# Patient Record
Sex: Female | Born: 1995 | Race: White | Hispanic: No | Smoking: Never smoker
Health system: Southern US, Community
[De-identification: ages and names within clinical notes are randomized; demographics above are authoritative.]

## PROBLEM LIST (undated history)

## (undated) DIAGNOSIS — F909 Attention-deficit hyperactivity disorder, unspecified type: Secondary | ICD-10-CM

## (undated) DIAGNOSIS — R55 Syncope and collapse: Secondary | ICD-10-CM

## (undated) DIAGNOSIS — F419 Anxiety disorder, unspecified: Secondary | ICD-10-CM

## (undated) HISTORY — DX: Anxiety disorder, unspecified: F41.9

## (undated) HISTORY — PX: MOUTH SURGERY: SHX715

---
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
## 2017-03-10 ENCOUNTER — Ambulatory Visit (INDEPENDENT_AMBULATORY_CARE_PROVIDER_SITE_OTHER): Payer: BLUE CROSS/BLUE SHIELD | Admitting: Obstetrics and Gynecology

## 2017-03-10 ENCOUNTER — Encounter: Payer: Self-pay | Admitting: Obstetrics and Gynecology

## 2017-03-10 VITALS — BP 118/74 | Ht 65.0 in | Wt 162.0 lb

## 2017-03-10 DIAGNOSIS — L700 Acne vulgaris: Secondary | ICD-10-CM

## 2017-03-10 DIAGNOSIS — Z803 Family history of malignant neoplasm of breast: Secondary | ICD-10-CM

## 2017-03-10 DIAGNOSIS — Z30011 Encounter for initial prescription of contraceptive pills: Secondary | ICD-10-CM

## 2017-03-10 MED ORDER — NORGESTIMATE-ETH ESTRADIOL 0.25-35 MG-MCG PO TABS: 1.0000 | ORAL_TABLET | Freq: Every day | ORAL | 3 refills | Status: DC

## 2017-03-10 NOTE — Progress Notes (Signed)
Chief Complaint  Patient presents with  . Follow-up    medication follow up/ocp refill    HPI:      Ms. Toni Cobb is a 21 y.o. G0P0000 who LMP was Patient's last menstrual period was 02/23/2017., presents today for [facility] restart. She was last seen 2/17 and started on sprintec for acne with sx improvement. Pt stopped pills 12/17 because complexion was so good. Sx have worsened and pt wants to restart pills. No side effects. No hx of migraines with aura/HTN/clotting disorders.  She has never been sex active.  Menses are monthly, last 5 day, no BTB, no dysmen.  No tobacco, alcohol, drug use.  FH breast cancer in her mom, who is BRCA neg.    Past Medical History:  Diagnosis Date  . ADHD (attention deficit hyperactivity disorder)   . Syncope     Past Surgical History:  Procedure Laterality Date  . MOUTH SURGERY      Family History  Problem Relation Age of Onset  . Breast cancer Mother     Social History   Social History  . Marital status: Unknown    Spouse name: N/A  . Number of children: N/A  . Years of education: N/A   Occupational History  . Not on file.   Social History Main Topics  . Smoking status: Never Smoker  . Smokeless tobacco: Never Used  . Alcohol use No  . Drug use: No  . Sexual activity: Not Currently    Birth control/ protection: Pill   Other Topics Concern  . Not on file   Social History Narrative   ** Merged History Encounter **         Current Outpatient Prescriptions:  .  FLUoxetine (PROZAC) 20 MG tablet, Take by mouth., Disp: , Rfl:  .  amphetamine-dextroamphetamine (ADDERALL) 10 MG tablet, Take 10 mg by mouth daily., Disp: , Rfl:  .  norgestimate-ethinyl estradiol (ORTHO-CYCLEN,SPRINTEC,PREVIFEM) 0.25-35 MG-MCG tablet, Take 1 tablet by mouth daily., Disp: 3 Package, Rfl: 3   ROS:  Review of Systems  Constitutional: Negative for fatigue, fever and unexpected weight change.  Respiratory: Negative for cough, shortness of breath  and wheezing.   Cardiovascular: Negative for chest pain, palpitations and leg swelling.  Gastrointestinal: Negative for blood in stool, constipation, diarrhea, nausea and vomiting.  Endocrine: Negative for cold intolerance, heat intolerance and polyuria.  Genitourinary: Positive for vaginal discharge. Negative for dyspareunia, dysuria, flank pain, frequency, genital sores, hematuria, menstrual problem, pelvic pain, urgency, vaginal bleeding and vaginal pain.  Musculoskeletal: Negative for back pain, joint swelling and myalgias.  Skin: Negative for rash.  Neurological: Negative for dizziness, syncope, light-headedness, numbness and headaches.  Hematological: Negative for adenopathy.  Psychiatric/Behavioral: Negative for agitation, confusion, sleep disturbance and suicidal ideas. The patient is not nervous/anxious.      OBJECTIVE:   Vitals:  BP 118/74   Ht '5\' 5"'$  (1.651 m)   Wt 162 lb (73.5 kg)   LMP 02/23/2017   BMI 26.96 kg/m   Physical Exam  Constitutional: She is oriented to person, place, and time and well-developed, well-nourished, and in no distress. Vital signs are normal.  Cardiovascular: Normal rate, regular rhythm and normal heart sounds.   Pulmonary/Chest: Effort normal and breath sounds normal. She has no wheezes.  Abdominal: Soft. There is no tenderness.  Neurological: She is alert and oriented to person, place, and time.  Psychiatric: Mood, memory, affect and judgment normal.  Vitals reviewed. GYN EXAM DEFERRED SINCE PT NEVER SEX ACTIVE  Assessment/Plan: Encounter for initial prescription of contraceptive pills - OCP start with next menses. condoms for 1 mo. F/u prn. - Plan: norgestimate-ethinyl estradiol (ORTHO-CYCLEN,SPRINTEC,PREVIFEM) 0.25-35 MG-MCG tablet  Family history of breast cancer - Qualifies for updated genetic testing when 23.  Acne vulgaris - Improved wtih OCPs.      Return in about 1 year (around 03/10/2018).  Lindia Garms B. Kabella Cassidy,  PA-C 03/10/2017 4:57 PM

## 2017-12-01 ENCOUNTER — Other Ambulatory Visit: Payer: Self-pay | Admitting: Obstetrics and Gynecology

## 2017-12-01 DIAGNOSIS — Z30011 Encounter for initial prescription of contraceptive pills: Secondary | ICD-10-CM

## 2017-12-29 ENCOUNTER — Other Ambulatory Visit: Payer: Self-pay | Admitting: Obstetrics and Gynecology

## 2017-12-29 DIAGNOSIS — Z30011 Encounter for initial prescription of contraceptive pills: Secondary | ICD-10-CM

## 2018-12-25 ENCOUNTER — Other Ambulatory Visit: Payer: Self-pay | Admitting: Obstetrics and Gynecology

## 2018-12-25 DIAGNOSIS — Z30011 Encounter for initial prescription of contraceptive pills: Secondary | ICD-10-CM

## 2019-01-04 ENCOUNTER — Other Ambulatory Visit: Payer: Self-pay | Admitting: Obstetrics and Gynecology

## 2019-01-04 DIAGNOSIS — Z30011 Encounter for initial prescription of contraceptive pills: Secondary | ICD-10-CM

## 2019-06-14 ENCOUNTER — Encounter: Payer: Self-pay | Admitting: Obstetrics and Gynecology

## 2019-07-01 ENCOUNTER — Other Ambulatory Visit: Payer: Self-pay

## 2019-07-05 ENCOUNTER — Ambulatory Visit: Payer: BC Managed Care – PPO | Admitting: Obstetrics and Gynecology

## 2019-07-05 ENCOUNTER — Other Ambulatory Visit: Payer: Self-pay

## 2019-07-05 ENCOUNTER — Encounter: Payer: Self-pay | Admitting: Obstetrics and Gynecology

## 2019-07-05 VITALS — BP 100/62 | HR 60 | Temp 97.3°F | Resp 14 | Ht 65.0 in | Wt 143.0 lb

## 2019-07-05 DIAGNOSIS — Z0000 Encounter for general adult medical examination without abnormal findings: Secondary | ICD-10-CM | POA: Diagnosis not present

## 2019-07-05 DIAGNOSIS — Z01419 Encounter for gynecological examination (general) (routine) without abnormal findings: Secondary | ICD-10-CM | POA: Diagnosis not present

## 2019-07-05 NOTE — Patient Instructions (Signed)

## 2019-07-05 NOTE — Progress Notes (Addendum)
23 y.o. G0P0000 Unknown Caucasian female here for annual exam.    Mother and grandmother with hx of breast cancer.  States she is has wondered if she will need mastectomy some day.   Took birth control in the past to help with acne but made her cramping worse. Her last menses was light and short.   She is working out a lot and lost 10 - 15 pounds.  She does usually get a cycle every month.   Montez Hageman. At [facility].  PCP: Kandyce Rud, MD    Patient's last menstrual period was 06/14/2019 (exact date).           Sexually active: No.  The current method of family planning is abstinence.    Exercising: Yes.    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  Done 5 months ago. Flu vaccine:  Completed.    reports that she has never smoked. She has never used smokeless tobacco. She reports current alcohol use of about 3.0 standard drinks of alcohol per week. She reports that she does not use drugs.  Past Medical History:  Diagnosis Date  . ADHD (attention deficit hyperactivity disorder)   . Anxiety   . Syncope     Past Surgical History:  Procedure Laterality Date  . MOUTH SURGERY      Current Outpatient Medications  Medication Sig Dispense Refill  . ADDERALL XR 15 MG 24 hr capsule Take 1 tablet by mouth daily. Takes only when in school    . Benzoyl Peroxide 7 % LIQD Apply 1 application topically as needed.    . clindamycin-tretinoin (ZIANA) gel Apply 1 application topically as needed.    . Dapsone 5 % topical gel Apply 1 application topically daily.    Marland Kitchen FLUoxetine (PROZAC) 20 MG tablet Take by mouth.    . mometasone (ELOCON) 0.1 % cream Apply 1 application topically 2 (two) times daily as needed.    . triamcinolone (KENALOG) 0.025 % cream Apply 1 application topically as needed.      No current facility-administered medications for this visit.     Family History  Problem Relation Age of Onset  . Breast cancer Mother 83  . Stroke Mother 2  . Breast cancer Maternal Grandmother 60    Review of Systems  All other systems reviewed and are negative.   Exam:   BP 100/62   Pulse 60   Temp (!) 97.3 F (36.3 C) (Temporal)   Resp 14   Ht 5\' 5"  (1.651 m)   Wt 143 lb (64.9 kg)   LMP 06/14/2019 (Exact Date)   BMI 23.80 kg/m     General appearance: alert, cooperative and appears stated age Head: normocephalic, without obvious abnormality, atraumatic Neck: no adenopathy, supple, symmetrical, trachea midline and thyroid normal to inspection and palpation Lungs: clear to auscultation bilaterally Breasts: normal appearance, no masses or tenderness, No nipple retraction or dimpling, No nipple discharge or bleeding, No axillary adenopathy Heart: regular rate and rhythm Abdomen: soft, non-tender; no masses, no organomegaly Extremities: extremities normal, atraumatic, no cyanosis or edema Skin: skin color, texture, turgor normal. No rashes or lesions Lymph nodes: cervical, supraclavicular, and axillary nodes normal. Neurologic: grossly normal  Pelvic: External genitalia:  no lesions  No abnormal inguinal nodes palpated.              Urethra:  normal appearing urethra with no masses, tenderness or lesions              Bartholins and Skenes: normal                 Vagina: normal appearing vagina with normal color and discharge, no lesions              Pelvic exam discontinued due to patient discomfort.    Chaperone was present for exam.  Assessment:   Well woman visit with normal exam. FH of breast cancer.  Anxiety.  On Prozac.   Plan: Mammogram screening at age 73 yo. Self breast awareness reviewed. Will do pap next year or sooner if becomes sexually active.  Condom use discussed. Guidelines for Calcium, Vitamin D, regular exercise program  including cardiovascular and weight bearing exercise. We discussed potential referral to genetics counselor for evaluation and potential testing.  I discussed referral to high risk clinic after seeing a geneticist.  She will consider.    Follow up annually and prn.   After visit summary provided.

## 2019-08-17 ENCOUNTER — Encounter: Payer: BC Managed Care – PPO | Admitting: Obstetrics and Gynecology

## 2019-10-06 ENCOUNTER — Ambulatory Visit: Payer: Self-pay | Attending: Internal Medicine

## 2019-10-06 DIAGNOSIS — Z23 Encounter for immunization: Secondary | ICD-10-CM

## 2019-10-06 NOTE — Progress Notes (Signed)
   Covid-19 Vaccination Clinic  Name:  Toni Cobb    MRN: [id number] DOB: 03-25-1996  10/06/2019  [name] was observed post Covid-19 immunization for 15 minutes without incident. She was provided with Vaccine Information Sheet and instruction to access the V-Safe system.   [name] was instructed to call 911 with any severe reactions post vaccine: Marland Kitchen Difficulty breathing  . Swelling of face and throat  . A fast heartbeat  . A bad rash all over body  . Dizziness and weakness   Immunizations Administered    Name Date Dose VIS Date Route   Pfizer COVID-19 Vaccine 10/06/2019  5:09 PM 0.3 mL 07/09/2019 Intramuscular   Manufacturer: [employer], [employer]   Lot: [id number]   NDC: [address]-9

## 2019-10-27 ENCOUNTER — Ambulatory Visit: Payer: BC Managed Care – PPO | Attending: Internal Medicine

## 2019-10-27 DIAGNOSIS — Z23 Encounter for immunization: Secondary | ICD-10-CM

## 2019-10-27 NOTE — Progress Notes (Signed)
   Covid-19 Vaccination Clinic  Name:  CHINMAYI RUMER    MRN: [id number] DOB: 1995-08-15  10/27/2019  Ms. Yaklin was observed post Covid-19 immunization for 15 minutes without incident. She was provided with Vaccine Information Sheet and instruction to access the V-Safe system.   Ms. Grobe was instructed to call 911 with any severe reactions post vaccine: Marland Kitchen Difficulty breathing  . Swelling of face and throat  . A fast heartbeat  . A bad rash all over body  . Dizziness and weakness   Immunizations Administered    Name Date Dose VIS Date Route   Pfizer COVID-19 Vaccine 10/27/2019  4:08 PM 0.3 mL 07/09/2019 Intramuscular   Manufacturer: [employer], [employer]   Lot: [id number]   NDC: [address]-1
